# Patient Record
Sex: Female | Born: 2000 | Race: White | Hispanic: No | Marital: Single | Smoking: Never smoker
Health system: Southern US, Community
[De-identification: ages and names within clinical notes are randomized; demographics above are authoritative.]

---
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
## 2021-08-26 ENCOUNTER — Ambulatory Visit (INDEPENDENT_AMBULATORY_CARE_PROVIDER_SITE_OTHER): Payer: Managed Care, Other (non HMO) | Admitting: Obstetrics & Gynecology

## 2021-08-26 ENCOUNTER — Encounter: Payer: Self-pay | Admitting: Obstetrics & Gynecology

## 2021-08-26 VITALS — BP 110/64 | HR 80 | Ht 65.75 in | Wt 141.0 lb

## 2021-08-26 DIAGNOSIS — Z3009 Encounter for other general counseling and advice on contraception: Secondary | ICD-10-CM | POA: Diagnosis not present

## 2021-08-26 DIAGNOSIS — Z01419 Encounter for gynecological examination (general) (routine) without abnormal findings: Secondary | ICD-10-CM

## 2021-08-26 NOTE — Progress Notes (Signed)
Patricia Evans [birth year] [id number]   History:    21 y.o. G0 Engaged, getting married.  Virgin.  RP:  New patient presenting for annual gyn exam   HPI: Menses regular normal every month.  No BTB.  No pelvic pain.  Virgin, but getting married.  Would like to use a Progesterone IUD for contraception.  Breasts normal.  BMI 22.93.    Past medical history,surgical history, family history and social history were all reviewed and documented in the EPIC chart.  Gynecologic History Patient's last menstrual period was 08/14/2021 (exact date).  Obstetric History OB History  Gravida Para Term Preterm AB Living  0 0 0 0 0 0  SAB IAB Ectopic Multiple Live Births  0 0 0 0 0    ROS: A ROS was performed and pertinent positives and negatives are included in the history. GENERAL: No fevers or chills. HEENT: No change in vision, no earache, sore throat or sinus congestion. NECK: No pain or stiffness. CARDIOVASCULAR: No chest pain or pressure. No palpitations. PULMONARY: No shortness of breath, cough or wheeze. GASTROINTESTINAL: No abdominal pain, nausea, vomiting or diarrhea, melena or bright red blood per rectum. GENITOURINARY: No urinary frequency, urgency, hesitancy or dysuria. MUSCULOSKELETAL: No joint or muscle pain, no back pain, no recent trauma. DERMATOLOGIC: No rash, no itching, no lesions. ENDOCRINE: No polyuria, polydipsia, no heat or cold intolerance. No recent change in weight. HEMATOLOGICAL: No anemia or easy bruising or bleeding. NEUROLOGIC: No headache, seizures, numbness, tingling or weakness. PSYCHIATRIC: No depression, no loss of interest in normal activity or change in sleep pattern.     Exam:   BP 110/64   Pulse 80   Ht 5' 5.75" (1.67 m)   Wt 141 lb (64 kg)   LMP 08/14/2021 (Exact Date)   SpO2 98%   BMI 22.93 kg/m   Body mass index is 22.93 kg/m.  General appearance : Well developed well nourished female. No acute distress HEENT: Eyes: no retinal hemorrhage or  exudates,  Neck supple, trachea midline, no carotid bruits, no thyroidmegaly Lungs: Clear to auscultation, no rhonchi or wheezes, or rib retractions  Heart: Regular rate and rhythm, no murmurs or gallops Breast:Examined in sitting and supine position were symmetrical in appearance, no palpable masses or tenderness,  no skin retraction, no nipple inversion, no nipple discharge, no skin discoloration, no axillary or supraclavicular lymphadenopathy Abdomen: no palpable masses or tenderness, no rebound or guarding Extremities: no edema or skin discoloration or tenderness  Pelvic: Vulva: Normal             Vagina: No gross lesions or discharge  Cervix: No gross lesions or discharge  Uterus  Strongly AV, normal size, shape and consistency, non-tender and mobile  Adnexa  Without masses or tenderness  Anus: Normal   Assessment/Plan:  21 y.o. female for annual exam   1. Well female exam with routine gynecological exam Menses regular normal every month.  No BTB.  No pelvic pain.  Virgin, but getting married.  Would like to use a Copper IUD for contraception.  Breasts normal.  BMI 22.93.    2. Encounter for counseling regarding contraception Contraception counseling done.  Prefers the copper IUD.  Benefits/risks and insertion procedure thoroughly reviewed.  F/U Paraguard IUD insertion when menstruating. - IUD Insertion; Future  Other orders - AZO-CRANBERRY PO; Take by mouth. - Multiple Vitamins-Minerals (ZINC PO); Take by mouth. - Cholecalciferol (VITAMIN D3 PO); Take by mouth.   Genia Del MD, 8:50 AM 08/26/2021
